# Patient Record
Sex: Male | Born: 2000 | Race: White | Hispanic: No | Marital: Single | Smoking: Never smoker
Health system: Southern US, Community
[De-identification: ages and names within clinical notes are randomized; demographics above are authoritative.]

## PROBLEM LIST (undated history)

## (undated) DIAGNOSIS — T7840XA Allergy, unspecified, initial encounter: Secondary | ICD-10-CM

## (undated) DIAGNOSIS — F840 Autistic disorder: Secondary | ICD-10-CM

## (undated) HISTORY — DX: Autistic disorder: F84.0

## (undated) HISTORY — DX: Allergy, unspecified, initial encounter: T78.40XA

---
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
## 2021-03-07 IMAGING — US US SCROTUM W/ DOPPLER COMPLETE
1 series · 14 of 25 positions shown · non-contrast
Comparison: None.

CLINICAL DATA: Left testicular swelling and pain since last night

EXAM:
SCROTAL ULTRASOUND
DOPPLER ULTRASOUND OF THE TESTICLES
TECHNIQUE: Complete ultrasound examination of the testicles, epididymis, and
other scrotal structures was performed. Color and spectral Doppler
ultrasound were also utilized to evaluate blood flow to the
testicles.

[Series 1: us scrotum w/ doppler complete · 14 of 47 slices shown]
[im 1/47]
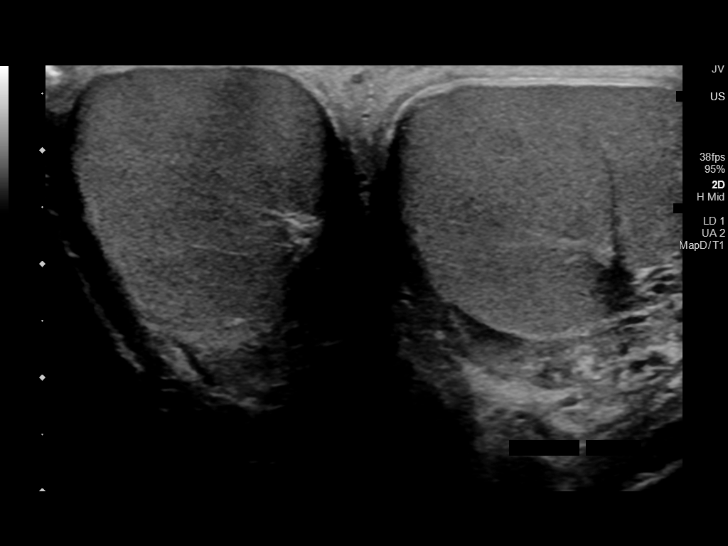
[im 4/47]
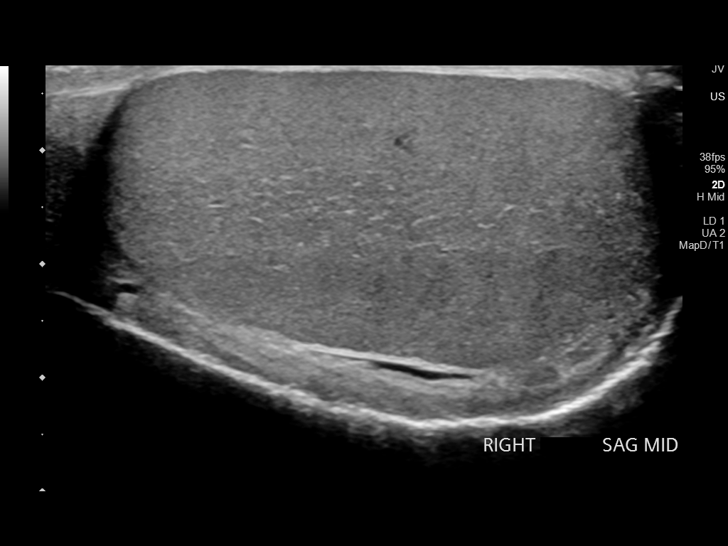
[im 8/47]
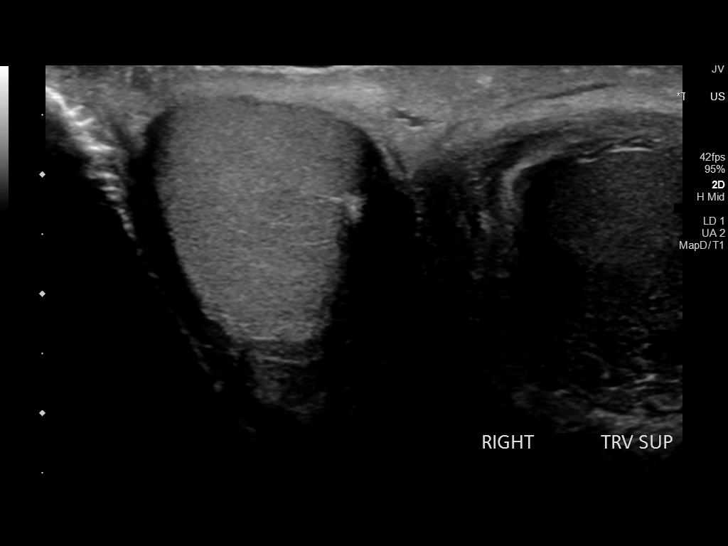
[im 12/47]
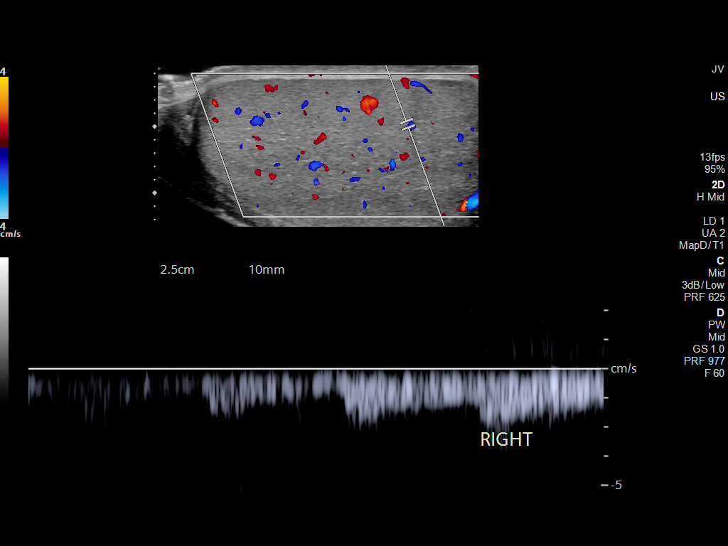
[im 16/47]
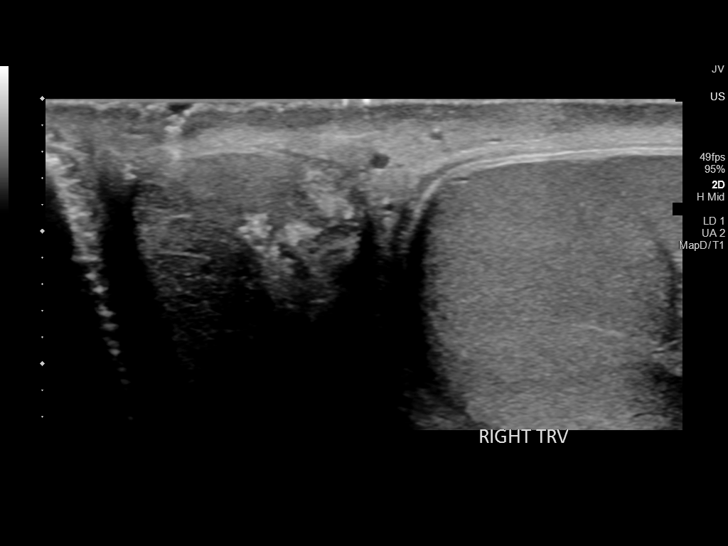
[im 18/47]
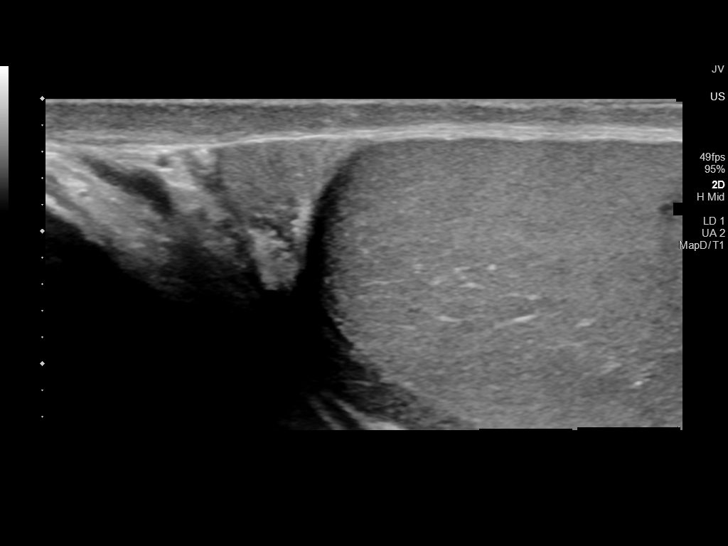
[im 22/47]
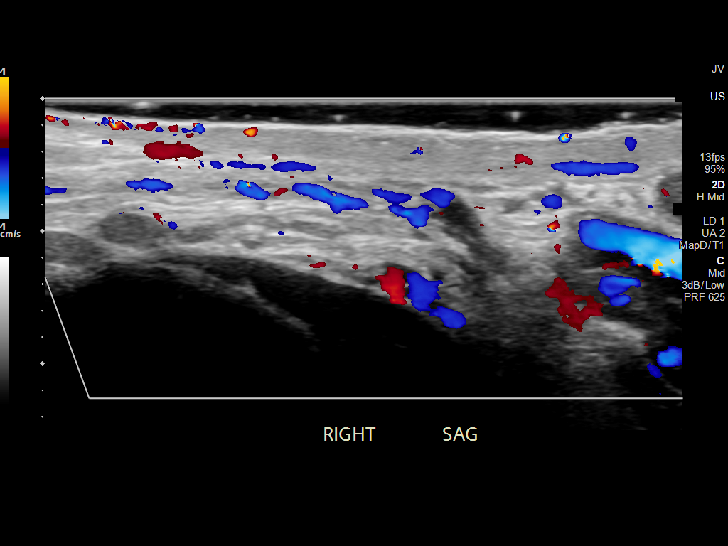
[im 25/47]
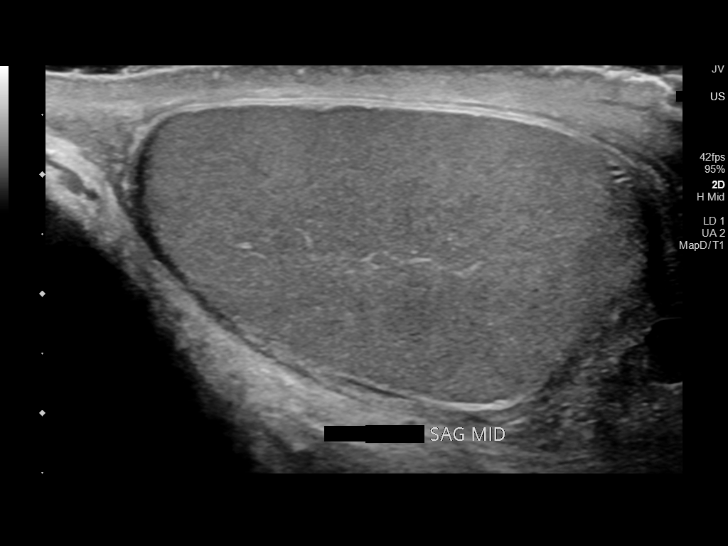
[im 29/47]
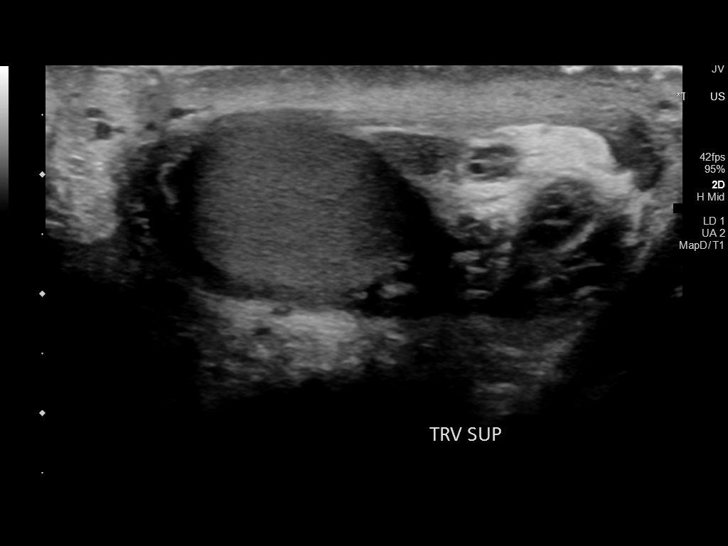
[im 31/47]
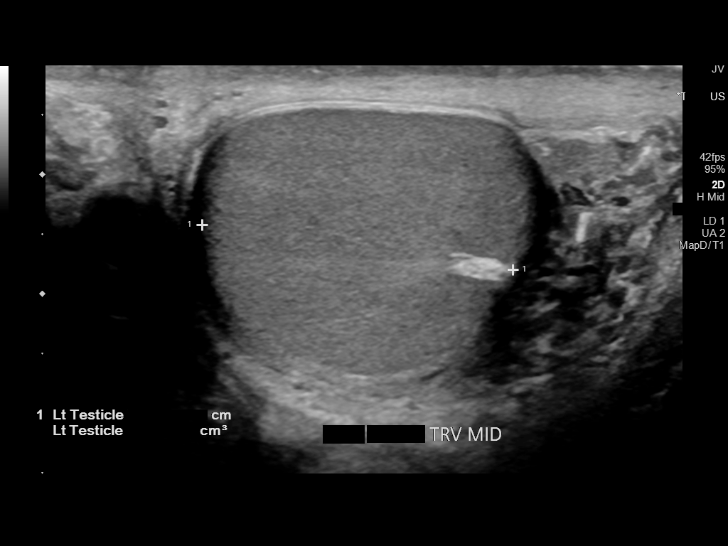
[im 35/47]
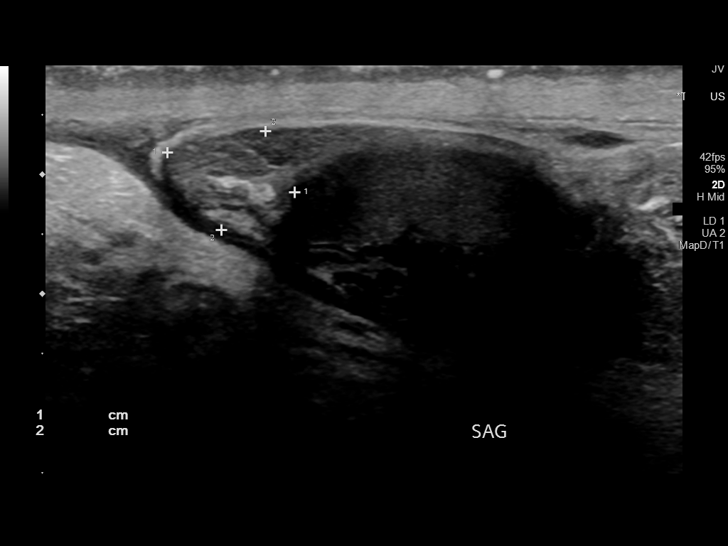
[im 39/47]
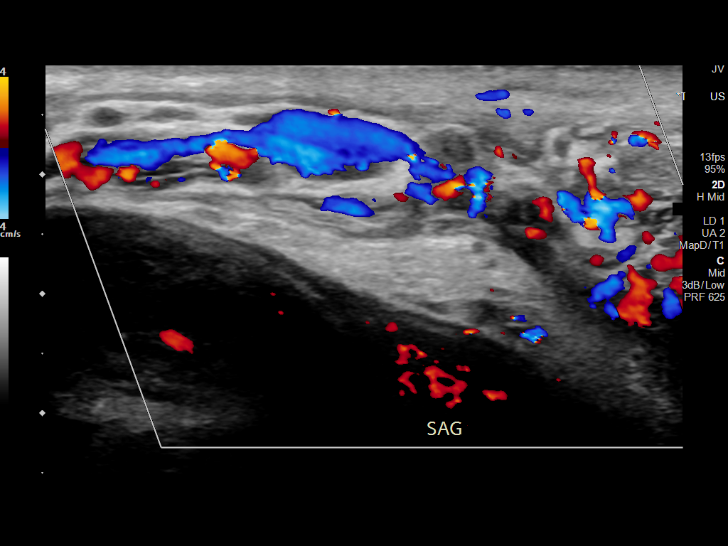
[im 43/47]
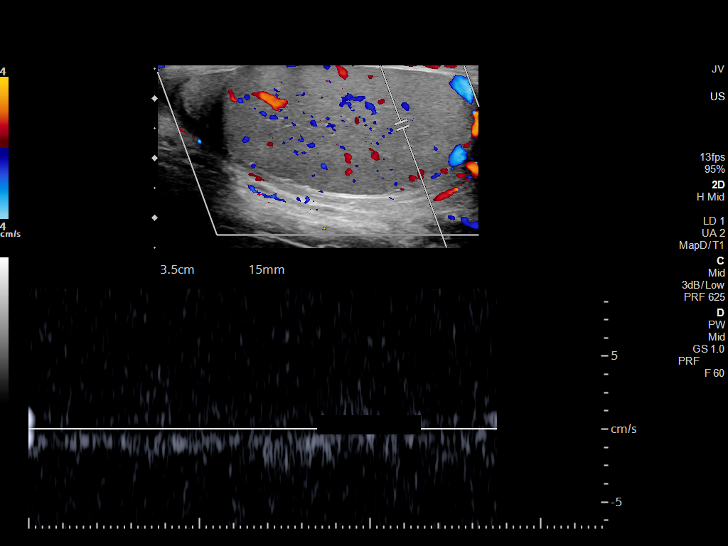
[im 47/47]
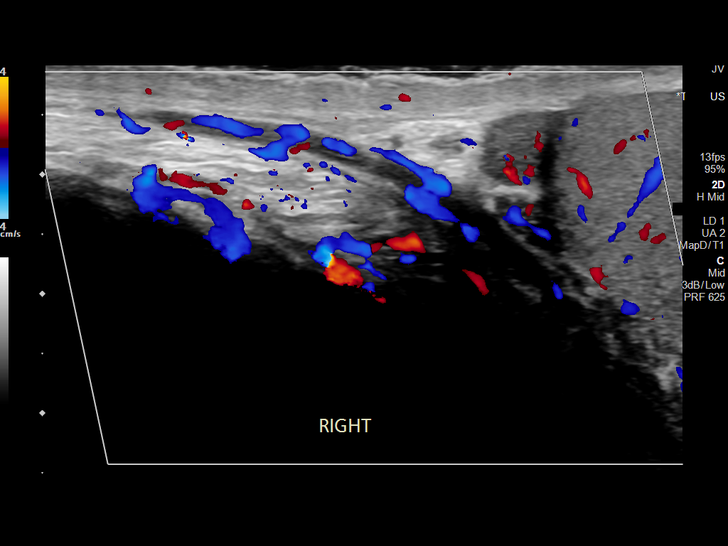

[14 of 25 positions shown; findings below may reference images not displayed]

FINDINGS: Right testicle

Measurements: 4.1 x 1.8 x 2.3 cm. No mass or microlithiasis
visualized.

Left testicle

Measurements: 3.6 x 1.9 x 2.2 cm. No mass or microlithiasis
visualized.

Right epididymis:  Normal in size and appearance.

Left epididymis:  The left epididymis is enlarged and hypervascular.

Hydrocele:  None visualized.

Varicocele:  None visualized.

Pulsed Doppler interrogation of both testes demonstrates normal low
resistance arterial and venous waveforms bilaterally.
IMPRESSION: Left epididymitis.

No evidence of testicular torsion.

## 2023-08-21 ENCOUNTER — Other Ambulatory Visit (HOSPITAL_COMMUNITY): Payer: Self-pay

## 2023-08-21 MED ORDER — LISDEXAMFETAMINE DIMESYLATE 50 MG PO CAPS
50.0000 mg | ORAL_CAPSULE | Freq: Every day | ORAL | 0 refills | Status: AC
  Filled 2023-08-21 – 2023-08-22 (×3): qty 30, 30d supply, fill #0

## 2023-08-22 ENCOUNTER — Other Ambulatory Visit (HOSPITAL_COMMUNITY): Payer: Self-pay

## 2023-08-24 ENCOUNTER — Other Ambulatory Visit (HOSPITAL_COMMUNITY): Payer: Self-pay

## 2023-09-18 ENCOUNTER — Other Ambulatory Visit (HOSPITAL_COMMUNITY): Payer: Self-pay
